# Patient Record
Sex: Male | Born: 1960 | Race: White | Hispanic: No | Marital: Married | Smoking: Never smoker
Health system: Southern US, Community
[De-identification: ages and names within clinical notes are randomized; demographics above are authoritative.]

## PROBLEM LIST (undated history)

## (undated) DIAGNOSIS — I4891 Unspecified atrial fibrillation: Secondary | ICD-10-CM

## (undated) DIAGNOSIS — R931 Abnormal findings on diagnostic imaging of heart and coronary circulation: Secondary | ICD-10-CM

## (undated) DIAGNOSIS — E559 Vitamin D deficiency, unspecified: Secondary | ICD-10-CM

## (undated) DIAGNOSIS — E7800 Pure hypercholesterolemia, unspecified: Secondary | ICD-10-CM

## (undated) HISTORY — DX: Vitamin D deficiency, unspecified: E55.9

## (undated) HISTORY — PX: OTHER SURGICAL HISTORY: SHX169

## (undated) HISTORY — DX: Abnormal findings on diagnostic imaging of heart and coronary circulation: R93.1

## (undated) HISTORY — DX: Pure hypercholesterolemia, unspecified: E78.00

## (undated) HISTORY — PX: VASECTOMY: SHX75

## (undated) HISTORY — DX: Unspecified atrial fibrillation: I48.91

## (undated) HISTORY — PX: MENISCECTOMY: SHX123

---

## 2009-08-17 ENCOUNTER — Emergency Department (HOSPITAL_COMMUNITY): Admission: EM | Admit: 2009-08-17 | Discharge: 2009-08-17 | Payer: Self-pay | Admitting: Emergency Medicine

## 2009-10-16 ENCOUNTER — Encounter: Admission: RE | Admit: 2009-10-16 | Discharge: 2009-10-16 | Payer: Self-pay | Admitting: Occupational Medicine

## 2009-10-19 ENCOUNTER — Encounter (INDEPENDENT_AMBULATORY_CARE_PROVIDER_SITE_OTHER): Payer: Self-pay | Admitting: *Deleted

## 2009-10-29 ENCOUNTER — Encounter (INDEPENDENT_AMBULATORY_CARE_PROVIDER_SITE_OTHER): Payer: Self-pay | Admitting: *Deleted

## 2009-11-02 ENCOUNTER — Ambulatory Visit: Payer: Self-pay | Admitting: Gastroenterology

## 2009-11-16 ENCOUNTER — Telehealth: Payer: Self-pay | Admitting: Gastroenterology

## 2009-12-14 ENCOUNTER — Ambulatory Visit: Payer: Self-pay | Admitting: Gastroenterology

## 2009-12-15 ENCOUNTER — Encounter: Payer: Self-pay | Admitting: Gastroenterology

## 2010-09-07 NOTE — Letter (Signed)
Summary: Previsit letter  [facility]  [address] [place], [place] [address]   Phone: [phone]  Fax: [phone]       10/19/2009 MRN: [id number]  Rodney Banks [address] [place], [place]  [address]  Dear [name],  Welcome to the Gastroenterology Division at [facility].    You are scheduled to see a nurse for your pre-procedure visit on 11-02-09 at 4:30p.m. on the 3rd floor at [facility], [address]  We ask that you try to arrive at our office 15 minutes prior to your appointment time to allow for check-in.  Your nurse visit will consist of discussing your medical and surgical history, your immediate family medical history, and your medications.    Please bring a complete list of all your medications or, if you prefer, bring the medication bottles and we will list them.  We will need to be aware of both prescribed and over the counter drugs.  We will need to know exact dosage information as well.  If you are on blood thinners (Coumadin, Plavix, Aggrenox, Ticlid, etc.) please call our office today/prior to your appointment, as we need to consult with your physician about holding your medication.   Please be prepared to read and sign documents such as consent forms, a financial agreement, and acknowledgement forms.  If necessary, and with your consent, a friend or relative is welcome to sit-in on the nurse visit with you.  Please bring your insurance card so that we may make a copy of it.  If your insurance requires a referral to see a specialist, please bring your referral form from your primary care physician.  No co-pay is required for this nurse visit.     If you cannot keep your appointment, please call [phone] to cancel or reschedule prior to your appointment date.  This allows [place] the opportunity to schedule an appointment for another patient in need of care.    Thank you for choosing [facility] for your medical needs.   We appreciate the opportunity to care for you.  Please visit [place] at our website  to learn more about our practice.                     Sincerely.                                                                                                                   The Gastroenterology Division

## 2010-09-07 NOTE — Miscellaneous (Signed)
Summary: [facility] Previsit/prep  Clinical Lists Changes  Medications: Added new medication of MOVIPREP 100 GM  SOLR (PEG-KCL-NACL-NASULF-NA ASC-C) As per prep instructions. - Signed Rx of MOVIPREP 100 GM  SOLR (PEG-KCL-NACL-NASULF-NA ASC-C) As per prep instructions.;  #1 x 0;  Signed;  Entered by: [name] RN;  Authorized by: Rachael Fee MD;  Method used: Electronically to [facility]  [phone]*, [address] [place], [place], [place]  [address], Ph: [phone] or [phone], Fax: [phone] Observations: Added new observation of NKA: T (11/02/2009 16:18)    Prescriptions: MOVIPREP 100 GM  SOLR (PEG-KCL-NACL-NASULF-NA ASC-C) As per prep instructions.  #1 x 0   Entered by:   [name] RN   Authorized by:   Rachael Fee MD   Signed by:   [name] RN on 11/02/2009   Method used:   Electronically to        [facility]  [phone]* (retail)       [address]       [place], [place]  [address]       Ph: [phone] or [phone]       Fax: [phone]   RxID:   [id number]

## 2010-09-07 NOTE — Letter (Signed)
Summary: Moviprep Instructions  [facility]  [address]   [place], [place] [address]   Phone: [phone]  Fax: [phone]       Rodney Banks    [birth year]    MRN: [id number]        Procedure Day /Date: Monday 11-16-09     Arrival Time: 9:00 a.m.     Procedure Time: 10:00 a.m.     Location of Procedure:                    x   [facility] (4th Floor)                        PREPARATION FOR COLONOSCOPY WITH MOVIPREP   Starting 5 days prior to your procedure  11-11-09 do not eat nuts, seeds, popcorn, corn, beans, peas,  salads, or any raw vegetables.  Do not take any fiber supplements (e.g. Metamucil, Citrucel, and Benefiber).  THE DAY BEFORE YOUR PROCEDURE         DATE:  11-15-09   DAY: Sunday  1.  Drink clear liquids the entire day-NO SOLID FOOD  2.  Do not drink anything colored red or purple.  Avoid juices with pulp.  No orange juice.  3.  Drink at least 64 oz. (8 glasses) of fluid/clear liquids during the day to prevent dehydration and help the prep work efficiently.  CLEAR LIQUIDS INCLUDE: Water Jello Ice Popsicles Tea (sugar ok, no milk/cream) Powdered fruit flavored drinks Coffee (sugar ok, no milk/cream) Gatorade Juice: apple, white grape, white cranberry  Lemonade Clear bullion, consomm, broth Carbonated beverages (any kind) Strained chicken noodle soup Hard Candy                             4.  In the morning, mix first dose of MoviPrep solution:    Empty 1 Pouch A and 1 Pouch B into the disposable container    Add lukewarm drinking water to the top line of the container. Mix to dissolve    Refrigerate (mixed solution should be used within 24 hrs)  5.  Begin drinking the prep at 5:00 p.m. The MoviPrep container is divided by 4 marks.   Every 15 minutes drink the solution down to the next mark (approximately 8 oz) until the full liter is complete.   6.  Follow completed prep with 16 oz of clear liquid of your choice  (Nothing red or purple).  Continue to drink clear liquids until bedtime.  7.  Before going to bed, mix second dose of MoviPrep solution:    Empty 1 Pouch A and 1 Pouch B into the disposable container    Add lukewarm drinking water to the top line of the container. Mix to dissolve    Refrigerate  THE DAY OF YOUR PROCEDURE      DATE: 11-16-09   DAY: Monday  Beginning at  5:00 a.m. (5 hours before procedure):         1. Every 15 minutes, drink the solution down to the next mark (approx 8 oz) until the full liter is complete.  2. Follow completed prep with 16 oz. of clear liquid of your choice.    3. You may drink clear liquids until 8:00 a.m.  (2 HOURS BEFORE PROCEDURE).   MEDICATION INSTRUCTIONS  Unless otherwise instructed, you should take regular prescription medications with a small sip of water  as early as possible the morning of your procedure.        OTHER INSTRUCTIONS  You will need a responsible adult at least 49 years of age to accompany you and drive you home.   This person must remain in the waiting room during your procedure.  Wear loose fitting clothing that is easily removed.  Leave jewelry and other valuables at home.  However, you may wish to bring a book to read or  an iPod/MP3 player to listen to music as you wait for your procedure to start.  Remove all body piercing jewelry and leave at home.  Total time from sign-in until discharge is approximately 2-3 hours.  You should go home directly after your procedure and rest.  You can resume normal activities the  day after your procedure.  The day of your procedure you should not:   Drive   Make legal decisions   Operate machinery   Drink alcohol   Return to work  You will receive specific instructions about eating, activities and medications before you leave.    The above instructions have been reviewed and explained to me by  Rodney Almas RN  November 02, 2009 5:10 PM     I fully  understand and can verbalize these instructions _____________________________ Date _________

## 2010-09-07 NOTE — Procedures (Signed)
Summary: Colonoscopy  Patient: Rodney Banks Note: All result statuses are Final unless otherwise noted.  Tests: (1) Colonoscopy (COL)   COL Colonoscopy           DONE     [facility]     [address]     [place], [place]  [address]           COLONOSCOPY PROCEDURE REPORT           PATIENT:  Rodney Banks, Rodney Banks  MR#:  [id number]     BIRTHDATE:  [birth year], 48 yrs. old  GENDER:  male     ENDOSCOPIST:  Rachael Fee, MD     referred by:   Kathrine Haddock, MD     PROCEDURE DATE:  12/14/2009     PROCEDURE:  Colonoscopy with snare polypectomy     ASA CLASS:  Class II     INDICATIONS:  Elevated Risk Screening, mother had colon cancer     MEDICATIONS:   Fentanyl 75 mcg IV, Versed 6 mg IV           DESCRIPTION OF PROCEDURE:   After the risks benefits and     alternatives of the procedure were thoroughly explained, informed     consent was obtained.  Digital rectal exam was performed and     revealed no rectal masses.   The LB [id number] endoscope     was introduced through the anus and advanced to the cecum, which     was identified by both the appendix and ileocecal valve, without     limitations.  The quality of the prep was adequate, using MiraLax.     The instrument was then slowly withdrawn as the colon was fully     examined.     <<PROCEDUREIMAGES>>     FINDINGS:  Three polyps were removed, all sent to pathology (jar     1). Two were sessile, 3-22mm across, removed with cold snare,     located in descending and sigmoid colon segments. One was     pedunculated, 6mm across, removed with snare/cautery, located in     descending colon (see image5 and image7).  This was otherwise a     normal examination of the colon (see image8, image2, and image3).     Retroflexed views in the rectum revealed no abnormalities.    The     scope was then withdrawn from the patient and the procedure     completed.     COMPLICATIONS:  None           ENDOSCOPIC IMPRESSION:     1)  Three subcentimeter polyps, all removed and sent to     pathology.     2) Otherwise normal examination           RECOMMENDATIONS:     1) Given your significant family history of colon cancer, you     should have a repeat colonoscopy in 5 years even if the polyps     removed today are NOT precancerous.  If ALL three polyps are     indeed precancerous, you will need a repeat colonoscopy in 3     years.     2) You will receive a letter within 1-2 weeks with the results     of your biopsy as well as final recommendations. Please call my     office if you have not received a letter after 3 weeks.  ______________________________     Rachael Fee, MD           n.     eSIGNED:   Rachael Fee at 12/14/2009 10:55 AM           Rodney Banks, [id number]  Note: An exclamation mark (!) indicates a result that was not dispersed into the flowsheet. Document Creation Date: 12/14/2009 10:55 AM _______________________________________________________________________  (1) Order result status: Final Collection or observation date-time: 12/14/2009 10:50 Requested date-time:  Receipt date-time:  Reported date-time:  Referring Physician:   Ordering Physician: Rob Bunting [phone]) Specimen Source:  Source: Launa Grill Order Number: [id number] Lab site:   Appended Document: Colonoscopy     Procedures Next Due Date:    Colonoscopy: 12/2014

## 2010-09-07 NOTE — Letter (Signed)
Summary: Results Letter  [facility]  [address] [place], [place] [address]   Phone: [phone]  Fax: [phone]        Dec 15, 2009 MRN: [id number]    Rodney Banks [address] [place], [place]  [address]    Dear [name],   At least one of the polyps removed during your recent procedure was proven to be adenomatous.  These are pre-cancerous polyps that may have grown into cancers if they had not been removed.  Based on current nationally recognized surveillance guidelines, I recommend that you have a repeat colonoscopy in 5 years.  We will therefore put your information in our reminder system and will contact you in 5 years to schedule a repeat procedure.  Please call if you have any questions or concerns.       Sincerely,  Rachael Fee MD  This letter has been electronically signed by your physician.  Appended Document: Results Letter letter mailed 5.11.11

## 2010-09-07 NOTE — Progress Notes (Signed)
Summary: cx same day proc  Phone Note Call from Patient Call back at Home Phone [phone]   Caller: Patient Call For: Rodney Banks Summary of Call: Patient cancelled his procedure for this morning because he has the flu and is running a fever. Patient states that he'll call back to reschedule.  Are you going to charge? Initial call taken by: Tawni Levy,  November 16, 2009 8:40 AM  Follow-up for Phone Call        no, not if he has flu symptoms Follow-up by: Rachael Fee MD,  November 16, 2009 8:46 AM     Appended Document: cx same day proc Patient NOT BILLED.

## 2014-04-25 ENCOUNTER — Encounter: Payer: Self-pay | Admitting: Gastroenterology

## 2014-06-13 ENCOUNTER — Ambulatory Visit (INDEPENDENT_AMBULATORY_CARE_PROVIDER_SITE_OTHER): Payer: 59 | Admitting: Cardiology

## 2014-06-13 ENCOUNTER — Encounter: Payer: Self-pay | Admitting: Cardiology

## 2014-06-13 ENCOUNTER — Encounter: Payer: Self-pay | Admitting: *Deleted

## 2014-06-13 VITALS — BP 110/74 | HR 75 | Ht 69.0 in | Wt 179.8 lb

## 2014-06-13 DIAGNOSIS — I480 Paroxysmal atrial fibrillation: Secondary | ICD-10-CM

## 2014-06-13 DIAGNOSIS — E785 Hyperlipidemia, unspecified: Secondary | ICD-10-CM

## 2014-06-13 DIAGNOSIS — I4891 Unspecified atrial fibrillation: Secondary | ICD-10-CM

## 2014-06-13 MED ORDER — DILTIAZEM HCL 120 MG PO TABS: 120.0000 mg | ORAL_TABLET | ORAL | Status: DC | PRN

## 2014-06-13 MED ORDER — DILTIAZEM HCL 120 MG PO TABS
120.0000 mg | ORAL_TABLET | ORAL | Status: DC | PRN
Start: 2014-06-13 — End: 2015-03-23

## 2014-06-13 MED ORDER — DILTIAZEM HCL ER COATED BEADS 120 MG PO CP24: ORAL_CAPSULE | ORAL | Status: DC

## 2014-06-13 NOTE — Progress Notes (Signed)
Patient ID: Rodney Banks, male   DOB: November 28, 1960, 53 y.o.   MRN: [id number]    Patient Name: Rodney Banks Date of Encounter: 06/13/2014  Primary Care Provider:  No primary care provider on file. Primary Cardiologist:  Rodney Banks  Problem List   Past Medical History  Diagnosis Date  . Atrial fibrillation   . Hypercholesteremia   . Vitamin D deficiency    Past Surgical History  Procedure Laterality Date  . Right leg      fracture surgical repair  . Bowel obstruction    . Fractureed right ankle    . Meniscectomy      both knees  . Vasectomy      Allergies  No Known Allergies  HPI  A very pleasant 53 year old male originally from [place], with prior medical history of proximal atrial fibrillation and hyperlipidemia who is coming to establish care for his atrial fibrillation. The patient states that his first episode of atrial fibrillation was 15 years ago he had a stressful weekend flying back from [place] getting over cold. That episode lasted for about 4 days when he presented to the ER and he eventually cardioverted on his own. He had another episode 3 years ago that lasted about 2 days. No episode of A. Fib or any palpitations since then. He denies any syncope. He denies any chest pain or dyspnea on exertion. He goes to gym 3 times a week for an hour. His father died of myocardial infarction at age of 74. He states that his diet is fairly poor as he eats lot of sweets especially chocolate.     Home Medications  Prior to Admission medications   Medication Sig Start Date End Date Taking? Authorizing Provider  aspirin 325 MG tablet Take 325 mg by mouth daily.   Yes Historical Provider, MD    Family History  Family History  Problem Relation Age of Onset  . Cancer - Colon Mother   . Heart attack Father     Social History  History   Social History  . Marital Status: Married    Spouse Name: N/A    Number of Children: N/A  . Years of Education: N/A     Occupational History  . Not on file.   Social History Main Topics  . Smoking status: Never Smoker   . Smokeless tobacco: Not on file  . Alcohol Use: Not on file  . Drug Use: Not on file  . Sexual Activity: Not on file   Other Topics Concern  . Not on file   Social History Narrative  . No narrative on file     Review of Systems, as per HPI, otherwise negative General:  No chills, fever, night sweats or weight changes.  Cardiovascular:  No chest pain, dyspnea on exertion, edema, orthopnea, palpitations, paroxysmal nocturnal dyspnea. Dermatological: No rash, lesions/masses Respiratory: No cough, dyspnea Urologic: No hematuria, dysuria Abdominal:   No nausea, vomiting, diarrhea, bright red blood per rectum, melena, or hematemesis Neurologic:  No visual changes, wkns, changes in mental status. All other systems reviewed and are otherwise negative except as noted above.  Physical Exam  Blood pressure 110/74, pulse 75, height 5\' 9"  (1.753 m), weight 179 lb 12.8 oz (81.557 kg).  General: Pleasant, NAD Psych: Normal affect. Neuro: Alert and oriented X 3. Moves all extremities spontaneously. HEENT: Normal  Neck: Supple without bruits or JVD. Lungs:  Resp regular and unlabored, CTA. Heart: RRR no s3, s4, or murmurs. Abdomen: Soft, non-tender,  non-distended, BS + x 4.  Extremities: No clubbing, cyanosis or edema. DP/PT/Radials 2+ and equal bilaterally.  Labs:  No results for input(s): CKTOTAL, CKMB, TROPONINI in the last 72 hours. No results found for: WBC, HGB, HCT, MCV, PLT  No results found for: DDIMER Invalid input(s): POCBNP No results found for: NA, K, CL, CO2, GLUCOSE, BUN, CREATININE, CALCIUM, PROT, ALBUMIN, AST, ALT, ALKPHOS, BILITOT, GFRNONAA, GFRAA No results found for: CHOL, HDL, LDLCALC, TRIG   HDL 56, triglycerides 86, LDL 178  Accessory Clinical Findings  Echocardiogram - none  ECG - SR, normal ECG    Assessment & Plan  53 year old male  1.  Paroxysmal atrial fibrillation - total of 2 episodes in last 15 years, the patient was nightly pill in the pocket diltiazem immediate release 120 mg as needed. His CHAD-VASc 1, we'll recommend to continue aspirin for now.  2. Blood pressure - controlled  3. Hyperlipidemia - the patient has high LDL 178, he is trying to avoid taking medicines and feels like he has lot of improvement in his diet. He is advised to buy over-the-counter red yeast rice  Follow up in one year with lipids.    Rodney Spark, MD, [facility] 06/13/2014, 8:46 AM

## 2014-06-13 NOTE — Patient Instructions (Signed)
Your physician has recommended you make the following change in your medication:    TAKE DILTIAZEM 120 MG AS NEEDED FOR ATRIAL FIBRILLATION    Your physician wants you to follow-up in: [place] will receive a reminder letter in the mail two months in advance. If you don't receive a letter, please call our office to schedule the follow-up appointment.

## 2014-12-26 ENCOUNTER — Encounter: Payer: Self-pay | Admitting: Gastroenterology

## 2015-01-16 ENCOUNTER — Encounter: Payer: Self-pay | Admitting: Gastroenterology

## 2015-03-23 ENCOUNTER — Ambulatory Visit (AMBULATORY_SURGERY_CENTER): Payer: Self-pay

## 2015-03-23 VITALS — Ht 69.0 in | Wt 178.6 lb

## 2015-03-23 DIAGNOSIS — Z860100 Personal history of colon polyps, unspecified: Secondary | ICD-10-CM

## 2015-03-23 MED ORDER — MOVIPREP 100 G PO SOLR: 1.0000 | Freq: Once | ORAL | Status: DC

## 2015-03-23 NOTE — Progress Notes (Signed)
No allergies to eggs or soy No diet/weight loss meds No home oxygen No past problems with anesthesia  Has email  Emmi instructions given for colonoscopy  No longer takes Dilitiazem for AFib

## 2015-04-06 ENCOUNTER — Encounter: Payer: Self-pay | Admitting: Gastroenterology

## 2015-04-28 ENCOUNTER — Encounter: Payer: Self-pay | Admitting: Gastroenterology

## 2015-04-28 ENCOUNTER — Ambulatory Visit (AMBULATORY_SURGERY_CENTER): Payer: 59 | Admitting: Gastroenterology

## 2015-04-28 VITALS — BP 115/77 | HR 61 | Temp 97.1°F | Resp 38 | Ht 69.0 in | Wt 178.0 lb

## 2015-04-28 DIAGNOSIS — Z800 Family history of malignant neoplasm of digestive organs: Secondary | ICD-10-CM

## 2015-04-28 DIAGNOSIS — Z86010 Personal history of colonic polyps: Secondary | ICD-10-CM | POA: Diagnosis present

## 2015-04-28 MED ORDER — SODIUM CHLORIDE 0.9 % IV SOLN: 500.0000 mL | INTRAVENOUS | Status: DC

## 2015-04-28 NOTE — Progress Notes (Signed)
Report to PACU, RN, vss, BBS= Clear.  

## 2015-04-28 NOTE — Patient Instructions (Signed)
YOU HAD AN ENDOSCOPIC PROCEDURE TODAY AT THE [facility]:   Refer to the procedure report that was given to you for any specific questions about what was found during the examination.  If the procedure report does not answer your questions, please call your gastroenterologist to clarify.  If you requested that your care partner not be given the details of your procedure findings, then the procedure report has been included in a sealed envelope for you to review at your convenience later.  YOU SHOULD EXPECT: Some feelings of bloating in the abdomen. Passage of more gas than usual.  Walking can help get rid of the air that was put into your GI tract during the procedure and reduce the bloating. If you had a lower endoscopy (such as a colonoscopy or flexible sigmoidoscopy) you may notice spotting of blood in your stool or on the toilet paper. If you underwent a bowel prep for your procedure, you may not have a normal bowel movement for a few days.  Please Note:  You might notice some irritation and congestion in your nose or some drainage.  This is from the oxygen used during your procedure.  There is no need for concern and it should clear up in a day or so.  SYMPTOMS TO REPORT IMMEDIATELY:   Following lower endoscopy (colonoscopy or flexible sigmoidoscopy):  Excessive amounts of blood in the stool  Significant tenderness or worsening of abdominal pains  Swelling of the abdomen that is new, acute  Fever of 100F or higher   For urgent or emergent issues, a gastroenterologist can be reached at any hour by calling [phone].   DIET: Your first meal following the procedure should be a small meal and then it is ok to progress to your normal diet. Heavy or fried foods are harder to digest and may make you feel nauseous or bloated.  Likewise, meals heavy in dairy and vegetables can increase bloating.  Drink plenty of fluids but you should avoid alcoholic beverages for 24  hours.  ACTIVITY:  You should plan to take it easy for the rest of today and you should NOT DRIVE or use heavy machinery until tomorrow (because of the sedation medicines used during the test).    FOLLOW UP: Our staff will call the number listed on your records the next business day following your procedure to check on you and address any questions or concerns that you may have regarding the information given to you following your procedure. If we do not reach you, we will leave a message.  However, if you are feeling well and you are not experiencing any problems, there is no need to return our call.  We will assume that you have returned to your regular daily activities without incident.  If any biopsies were taken you will be contacted by phone or by letter within the next 1-3 weeks.  Please call us at [phone] if you have not heard about the biopsies in 3 weeks.    SIGNATURES/CONFIDENTIALITY: You and/or your care partner have signed paperwork which will be entered into your electronic medical record.  These signatures attest to the fact that that the information above on your After Visit Summary has been reviewed and is understood.  Full responsibility of the confidentiality of this discharge information lies with you and/or your care-partner. 

## 2015-04-28 NOTE — Op Note (Signed)
[facility]  [address] [place], [address]   COLONOSCOPY PROCEDURE REPORT  PATIENT: Rodney Banks, Rodney Banks  MR#: [id number] BIRTHDATE: [birth year] , 15  yrs. old GENDER: male ENDOSCOPIST: Milus Banister, MD PROCEDURE DATE:  04/28/2015 PROCEDURE:   Colonoscopy, surveillance First Screening Colonoscopy - Avg.  risk and is 50 yrs.  old or older - No.  Prior Negative Screening - Now for repeat screening. N/A  History of Adenoma - Now for follow-up colonoscopy & has been > or = to 3 yrs.  Yes hx of adenoma.  Has been 3 or more years since last colonoscopy.  History of Adenoma - Now for follow-up colonoscopy & has been > or = to 3 yrs.  high risk ASA CLASS:   Class II INDICATIONS:Surveillance due to prior colonic neoplasia and colonoscopy 2011 Dr.  Ardis Hughs 3 polyps (two were TAs), also his mother had colon cancer. MEDICATIONS: Monitored anesthesia care and Propofol 200 mg IV  DESCRIPTION OF PROCEDURE:   After the risks benefits and alternatives of the procedure were thoroughly explained, informed consent was obtained.  The digital rectal exam revealed no abnormalities of the rectum.   The LB [id number]  endoscope was introduced through the anus and advanced to the cecum, which was identified by both the appendix and ileocecal valve. No adverse events experienced.   The quality of the prep was adequate  The instrument was then slowly withdrawn as the colon was fully examined. Estimated blood loss is zero unless otherwise noted in this procedure report.    COLON FINDINGS: A normal appearing cecum, ileocecal valve, and appendiceal orifice were identified.  The ascending, transverse, descending, sigmoid colon, and rectum appeared unremarkable. Retroflexed views revealed no abnormalities. The time to cecum = 7.2 Withdrawal time = 8.2   The scope was withdrawn and the procedure completed. COMPLICATIONS: There were no immediate complications.  ENDOSCOPIC  IMPRESSION: Normal colonoscopy No polyps or cancers  RECOMMENDATIONS: Given your significant family history of colon cancer (mother), you should have a repeat colonoscopy in 5 years  eSigned:  Milus Banister, MD 04/28/2015 2:52 PM   cc: Randa Ngo, MD

## 2015-04-29 ENCOUNTER — Telehealth: Payer: Self-pay

## 2015-04-29 NOTE — Telephone Encounter (Signed)
  Follow up Call-  Call back number 04/28/2015  Post procedure Call Back phone  # [phone]  Permission to leave phone message Yes     Patient questions:  Do you have a fever, pain , or abdominal swelling? No. Pain Score  0 *  Have you tolerated food without any problems? Yes.    Have you been able to return to your normal activities? Yes.    Do you have any questions about your discharge instructions: Diet   No. Medications  No. Follow up visit  No.  Do you have questions or concerns about your Care? No.  Actions: * If pain score is 4 or above: No action needed, pain <4.  No problems per the pt. maw

## 2017-05-29 ENCOUNTER — Telehealth: Payer: Self-pay

## 2017-05-29 NOTE — Telephone Encounter (Signed)
SENT NOTES TO SCHEDULING 

## 2017-07-05 ENCOUNTER — Telehealth: Payer: Self-pay | Admitting: Cardiology

## 2017-07-05 NOTE — Telephone Encounter (Signed)
Received incoming records from [facility] for upcoming appointment on 07/13/17 @ 9:40am with Dr. Percival Spanish. Records given to [facility] in Medical Records. 07/05/17 ab

## 2017-07-12 NOTE — Progress Notes (Signed)
Cardiology Office Note   Date:  07/13/2017   ID:  Griffen Frayne, DOB November 08, 1960, MRN [id number]  PCP:  London Pepper, MD  Cardiologist:   Minus Breeding, MD  Referring:  London Pepper, MD  Chief Complaint  Patient presents with  . Shortness of Breath     History of Present Illness: Rodney Banks is a 56 y.o. male who is referred by London Pepper, MD for evaluation of dyspnea.  He saw Dr. Meda Coffee in 2015.  He had paroxysms of a couple of times and in 2015 was treated with pill in pocket Cardizem.  However, he has never needed to use that.  He has not had any tachypalpitations since that time.  He might get dyspnea with significant exertion.  However, because of this and because of a strong family history of early coronary disease he is referred.  He exercises routinely.  He  runs does not elliptical and bikes.  At this he denies any chest pressure, neck or arm discomfort.  He does any palpitations, presyncope or syncope.     He denies any PND orthopnea.   Past Medical History:  Diagnosis Date  . Atrial fibrillation ([place])   . Hypercholesteremia   . Vitamin D deficiency     Past Surgical History:  Procedure Laterality Date  . bowel obstruction    . fractureed right ankle    . MENISCECTOMY     both knees  . right leg     fracture surgical repair  . VASECTOMY       No current outpatient medications on file.   No current facility-administered medications for this visit.     Allergies:   Patient has no known allergies.    Social History:  The patient  reports that  has never smoked. he has never used smokeless tobacco. He reports that he drinks about 0.6 oz of alcohol per week. He reports that he does not use drugs.   Family History:  The patient's family history includes Cancer - Colon in his mother; Colon cancer in his mother; Heart attack (age of onset: 2) in his father; Heart disease in his paternal grandfather; Lung disease in his maternal grandfather.    ROS:   Please see the history of present illness.   Otherwise, review of systems are positive for none.   All other systems are reviewed and negative.    PHYSICAL EXAM: VS:  BP 110/74   Pulse 69   Ht 5\' 9"  (1.753 m)   Wt 173 lb (78.5 kg)   BMI 25.55 kg/m  , BMI Body mass index is 25.55 kg/m. GENERAL:  Well appearing HEENT:  Pupils equal round and reactive, fundi not visualized, oral mucosa unremarkable NECK:  No jugular venous distention, waveform within normal limits, carotid upstroke brisk and symmetric, no bruits, no thyromegaly LYMPHATICS:  No cervical, inguinal adenopathy LUNGS:  Clear to auscultation bilaterally BACK:  No CVA tenderness CHEST:  Unremarkable HEART:  PMI not displaced or sustained,S1 and S2 within normal limits, no S3, no S4, no clicks, no rubs, no murmurs ABD:  Flat, positive bowel sounds normal in frequency in pitch, no bruits, no rebound, no guarding, no midline pulsatile mass, no hepatomegaly, no splenomegaly EXT:  2 plus pulses throughout, no edema, no cyanosis no clubbing SKIN:  No rashes no nodules NEURO:  Cranial nerves II through XII grossly intact, motor grossly intact throughout PSYCH:  Cognitively intact, oriented to person place and time    EKG:  EKG  is ordered today. The ekg ordered today demonstrates sinus rhythm, rate 69, axis within normal limits, intervals within normal limits, no acute ST-T wave changes.   Recent Labs: No results found for requested labs within last 8760 hours.    Lipid Panel No results found for: CHOL, TRIG, HDL, CHOLHDL, VLDL, LDLCALC, LDLDIRECT    Wt Readings from Last 3 Encounters:  07/13/17 173 lb (78.5 kg)  04/28/15 178 lb (80.7 kg)  03/23/15 178 lb 9.6 oz (81 kg)      Other studies Reviewed: Additional studies/ records that were reviewed today include: Office records. Review of the above records demonstrates:  Please see elsewhere in the note.     ASSESSMENT AND PLAN:  DYSPNEA: This is mild but he does have  a strong family history.  Screening with a coronary calcium would be very helpful.  This also would help calculate his risk and to allow [place] to understand further med titration as below.  DYSLIPIDEMIA: His last LDL was 152 with triglycerides 64 and HDL of 70.  His Framingham risk score would be 6%.  In order to calculate a MESA score I will order the coronary calcium.  This will determine whether or not he should be on a statin or whether he should take aspirin.  PAF:  Mr. Arieon Corcoran has a CHA2DS2 - VASc score of 1.  He has had no recurrent symptoms consistent with this.  No change in therapy is indicated.  Current medicines are reviewed at length with the patient today.  The patient does not have concerns regarding medicines.  The following changes have been made:  no change  Labs/ tests ordered today include:   Orders Placed This Encounter  Procedures  . CT CARDIAC SCORING  . EKG 12-Lead     Disposition:   FU with me as needed    Signed, Minus Breeding, MD  07/13/2017 12:53 PM    [place]

## 2017-07-13 ENCOUNTER — Encounter: Payer: Self-pay | Admitting: Cardiology

## 2017-07-13 ENCOUNTER — Ambulatory Visit: Payer: 59 | Admitting: Cardiology

## 2017-07-13 VITALS — BP 110/74 | HR 69 | Ht 69.0 in | Wt 173.0 lb

## 2017-07-13 DIAGNOSIS — E785 Hyperlipidemia, unspecified: Secondary | ICD-10-CM | POA: Diagnosis not present

## 2017-07-13 DIAGNOSIS — R0602 Shortness of breath: Secondary | ICD-10-CM | POA: Diagnosis not present

## 2017-07-13 DIAGNOSIS — I480 Paroxysmal atrial fibrillation: Secondary | ICD-10-CM

## 2017-07-13 NOTE — Patient Instructions (Signed)
Medication Instructions:  Continue current medications  If you need a refill on your cardiac medications before your next appointment, please call your pharmacy.  Labwork: None Ordered   Testing/Procedures: Your physician has requested that you have a Coronary Calcium Score Test. This test is done at our [address] Office.   Follow-Up: Your physician wants you to follow-up in: As Needed.      Thank you for choosing [facility]!!       

## 2017-07-20 ENCOUNTER — Ambulatory Visit (INDEPENDENT_AMBULATORY_CARE_PROVIDER_SITE_OTHER)
Admission: RE | Admit: 2017-07-20 | Discharge: 2017-07-20 | Disposition: A | Discharge status: Home / Self Care | Payer: 59 | Source: Ambulatory Visit | Attending: Cardiology | Admitting: Cardiology

## 2017-07-20 DIAGNOSIS — R0602 Shortness of breath: Secondary | ICD-10-CM

## 2017-07-24 ENCOUNTER — Encounter: Payer: Self-pay | Admitting: Podiatry

## 2017-07-24 ENCOUNTER — Ambulatory Visit: Payer: 59 | Admitting: Podiatry

## 2017-07-24 DIAGNOSIS — L600 Ingrowing nail: Secondary | ICD-10-CM

## 2017-07-24 DIAGNOSIS — L03032 Cellulitis of left toe: Secondary | ICD-10-CM

## 2017-07-24 NOTE — Patient Instructions (Signed)

## 2017-07-25 ENCOUNTER — Telehealth: Payer: Self-pay | Admitting: Cardiology

## 2017-07-25 NOTE — Progress Notes (Signed)
Subjective:   Patient ID: Rodney Banks, male   DOB: 56 y.o.   MRN: [id number]   HPI Patient presents stating he has had an irritated left hallux nail and he feels like it is going to get further infected and he states that he is getting ready to go to [place] hiking and he would like something temporary with something to be done long-term when he returns   Review of Systems  All other systems reviewed and are negative.       Objective:  Physical Exam  Constitutional: He appears well-developed and well-nourished.  Cardiovascular: Intact distal pulses.  Pulmonary/Chest: Effort normal.  Musculoskeletal: Normal range of motion.  Neurological: He is alert.  Skin: Skin is warm.  Nursing note and vitals reviewed.   Neurovascular status intact muscle strength adequate range of motion within normal limits with patient found to have incurvated nail border medial left with pain distal redness but no active drainage noted.  It has been sore for fairly long time and is incurvated in the corner and tender with palpation     Assessment:  Ingrown toenail deformity left hallux with paronychia of a mild nature     Plan:  H&P condition reviewed and at this point I recommended careful removal of the nail border with cleaning of the flash allowing channel for drainage and then permanent procedure to be done in approximately 1 month.  I infiltrated the left hallux 60 mg like Marcaine mixture under sterile conditions I removed some of the medial border remove some proud flesh to allow drainage and applied sterile dressing.  Reappoint for permanent procedure in approximately 1 month.[email or web address]

## 2017-07-25 NOTE — Telephone Encounter (Signed)
°  Follow Up  Calling to follow up on call from late last week regarding results from Dr. Percival Spanish. Please call.

## 2017-07-25 NOTE — Telephone Encounter (Signed)
Pt calling requesting results on coronary calcium test.

## 2017-07-26 NOTE — Telephone Encounter (Signed)
Spoke with pt, he reports dr hochrein called him Friday? And left a message. The patient is at the airport and will be in [place] for 3 weeks and wanted to get the results of his cardiac CT scan. Patient aware there are no comments on the result from dr hochrein. The patient would like me to make dr hochrein aware to return his call. Will forward to dr hochrein in the [place] office. Pt agreed with this plan.

## 2017-07-27 NOTE — Telephone Encounter (Signed)
Dr Percival Spanish attempted to contact pt by phone yesterday from the [facility] office.  He left the patient a message he will try to contact him again to discuss the results of his testing.

## 2017-08-22 ENCOUNTER — Telehealth: Payer: Self-pay | Admitting: *Deleted

## 2017-08-22 DIAGNOSIS — R931 Abnormal findings on diagnostic imaging of heart and coronary circulation: Secondary | ICD-10-CM

## 2017-08-22 NOTE — Telephone Encounter (Signed)
-----   Message from Minus Breeding, MD sent at 08/22/2017 12:33 PM EST ----- Abnormal calcium score.  Please schedule POET (Plain Old Exercise Treadmill).

## 2017-08-22 NOTE — Telephone Encounter (Signed)
Pt aware of his coronary calcium score, GXT ordered and send to scheduler to be schedule, pt made aware that someone from our scheduling department will call and schedule GXT.

## 2017-08-24 ENCOUNTER — Encounter: Payer: Self-pay | Admitting: Podiatry

## 2017-08-24 ENCOUNTER — Ambulatory Visit: Payer: 59 | Admitting: Podiatry

## 2017-08-24 DIAGNOSIS — L600 Ingrowing nail: Secondary | ICD-10-CM

## 2017-08-24 NOTE — Patient Instructions (Signed)

## 2017-08-24 NOTE — Telephone Encounter (Signed)
Barbaraann Barthel N      08/22/17 2:02 PM  Note    Pt aware of his coronary calcium score, GXT ordered and send to scheduler to be schedule, pt made aware that someone from our scheduling department will call and schedule GXT.    Barbaraann Barthel N      08/22/17 2:00 PM  Note    ----- Message from Minus Breeding, MD sent at 08/22/2017 12:33 PM EST ----- Abnormal calcium score.  Please schedule POET (Plain Old Exercise Treadmill).

## 2017-08-25 NOTE — Progress Notes (Signed)
Subjective:   Patient ID: Rodney Banks, male   DOB: 57 y.o.   MRN: [id number]   HPI Patient presents stating his nails are great on the trip and is ready to have it fixed permanently and he did have slight concerns about his right one also   ROS      Objective:  Physical Exam  Neurovascular status is intact with patient found to have a crusted irritated left hallux medial border from previous paronychia excision with mild changes on the distal portion of the right hallux with slight discomfort noted     Assessment:  Chronic ingrown toenail deformity left hallux with mild deformity right     Plan:  H&P condition reviewed and will get a focus on the left one.  I discussed correction and explained risk and allow patient to sign consent form.  Today I infiltrated the left hallux 60 mg Xylocaine Marcaine mixture did sterile prep to the left hallux and under sterile conditions with sterile instrumentation remove the medial border exposed root and applied phenol 3 applications 30 seconds followed by alcohol lavage and sterile dressing.  Gave instructions on soaks and reappoint and patient is encouraged to call with any questions

## 2017-08-30 ENCOUNTER — Telehealth (HOSPITAL_COMMUNITY): Payer: Self-pay

## 2017-08-30 NOTE — Telephone Encounter (Signed)
Encounter complete. 

## 2017-09-01 ENCOUNTER — Ambulatory Visit (HOSPITAL_COMMUNITY)
Admission: RE | Admit: 2017-09-01 | Discharge: 2017-09-01 | Disposition: A | Discharge status: Home / Self Care | Payer: 59 | Source: Ambulatory Visit | Attending: Cardiology | Admitting: Cardiology

## 2017-09-01 ENCOUNTER — Encounter (HOSPITAL_COMMUNITY): Payer: Self-pay | Admitting: *Deleted

## 2017-09-01 DIAGNOSIS — R931 Abnormal findings on diagnostic imaging of heart and coronary circulation: Secondary | ICD-10-CM | POA: Diagnosis not present

## 2017-09-01 LAB — EXERCISE TOLERANCE TEST
CHL CUP RESTING HR STRESS: 76 {beats}/min
CSEPEDS: 1 s
CSEPEW: 17.5 METS
CSEPPHR: 173 {beats}/min
Exercise duration (min): 15 min
MPHR: 165 {beats}/min
Percent HR: 104 %
RPE: 18

## 2017-09-01 NOTE — Progress Notes (Unsigned)
Abnormal ETT was reviewed by DR. Crenshaw. Patient was given the ok to be discharged to go home.

## 2017-09-14 ENCOUNTER — Encounter: Payer: Self-pay | Admitting: Cardiology

## 2017-09-14 NOTE — Progress Notes (Addendum)
Cardiology Office Note   Date:  09/15/2017   ID:  Rodney Banks, DOB [birth year], MRN [id number]  PCP:  London Pepper, MD  Cardiologist:   Minus Breeding, MD  Referring:  London Pepper, MD   Chief Complaint  Patient presents with  . Abnormal Stress Test     History of Present Illness: Rodney Banks is a 57 y.o. male who is referred by London Pepper, MD for evaluation of coronary calcium.  He has SOB and sent him for a calcium score that was 174 and 86th percentile.  I sent him for a POET (Plain Old Exercise Treadmill).  I reviewed the treadmill.  At the very peak of exercise there were ST changes that could represent ischemia.  He has no symptoms still.  The patient denies any new symptoms such as chest discomfort, neck or arm discomfort. There has been no new shortness of breath, PND or orthopnea. There have been no reported palpitations, presyncope or syncope.   Past Medical History:  Diagnosis Date  . Atrial fibrillation ([place])   . Elevated coronary artery calcium score   . Hypercholesteremia   . Vitamin D deficiency     Past Surgical History:  Procedure Laterality Date  . bowel obstruction    . fractureed right ankle    . MENISCECTOMY     both knees  . right leg     fracture surgical repair  . VASECTOMY       Current Outpatient Medications  Medication Sig Dispense Refill  . metoprolol tartrate (LOPRESSOR) 50 MG tablet Take 1 tablet (50 mg total) by mouth once for 1 dose. 1 tablet 0   No current facility-administered medications for this visit.     Allergies:   Patient has no known allergies.    ROS:  Please see the history of present illness.   Otherwise, review of systems are positive for none.   All other systems are reviewed and negative.    PHYSICAL EXAM: VS:  BP 106/70   Pulse 75   Ht 5\' 9"  (1.753 m)   Wt 176 lb 12.8 oz (80.2 kg)   BMI 26.11 kg/m  , BMI Body mass index is 26.11 kg/m.  GENERAL:  Well appearing NECK:  No jugular venous  distention, waveform within normal limits, carotid upstroke brisk and symmetric, no bruits, no thyromegaly LUNGS:  Clear to auscultation bilaterally CHEST:  Unremarkable HEART:  PMI not displaced or sustained,S1 and S2 within normal limits, no S3, no S4, no clicks, no rubs, no murmurs ABD:  Flat, positive bowel sounds normal in frequency in pitch, no bruits, no rebound, no guarding, no midline pulsatile mass, no hepatomegaly, no splenomegaly EXT:  2 plus pulses throughout, no edema, no cyanosis no clubbing   EKG:  EKG is not  ordered today.   Recent Labs: No results found for requested labs within last 8760 hours.    Lipid Panel No results found for: CHOL, TRIG, HDL, CHOLHDL, VLDL, LDLCALC, LDLDIRECT    Wt Readings from Last 3 Encounters:  09/15/17 176 lb 12.8 oz (80.2 kg)  07/13/17 173 lb (78.5 kg)  04/28/15 178 lb (80.7 kg)      Other studies Reviewed: Additional studies/ records that were reviewed today include: POET (Plain Old Exercise Treadmill) Review of the above records demonstrates:  See elsewhere   ASSESSMENT AND PLAN:  CORONARY CALCIUM:    I will address this as below.  ABNORMAL POET (Plain Old Exercise Treadmill):  Given this and his coronary  calcium along with risk factors the possibility of obstructive CAD is moderately high.  Further imaging is indicated and I will schedule a coronary CTA.    DYSLIPIDEMIA: His last LDL was 152 with triglycerides 64 and HDL of 70.  His Framingham risk score would be 6%.  MESA score was 8.8.  He will think about whether he wants to take a statin based on the results of the CT.  PAF:  Mr. [name] has a CHA2DS2 - VASc score of  1.  He had no symptomatic recurrence.  No change in therapy is indicated.  Current medicines are reviewed at length with the patient today.  The patient does not have concerns regarding medicines.  The following changes have been made:   None  Labs/ tests ordered today include:    Orders Placed  This Encounter  Procedures  . CT CORONARY MORPH W/CTA COR W/SCORE W/CA W/CM &/OR WO/CM  . CT CORONARY FRACTIONAL FLOW RESERVE DATA PREP  . CT CORONARY FRACTIONAL FLOW RESERVE FLUID ANALYSIS  . Basic Metabolic Panel (BMET)     Disposition:   FU with me in one year.    Signed, Minus Breeding, MD  09/15/2017 4:14 PM    [facility]

## 2017-09-15 ENCOUNTER — Ambulatory Visit: Payer: 59 | Admitting: Cardiology

## 2017-09-15 ENCOUNTER — Encounter: Payer: Self-pay | Admitting: Cardiology

## 2017-09-15 VITALS — BP 106/70 | HR 75 | Ht 69.0 in | Wt 176.8 lb

## 2017-09-15 DIAGNOSIS — R931 Abnormal findings on diagnostic imaging of heart and coronary circulation: Secondary | ICD-10-CM

## 2017-09-15 DIAGNOSIS — R9439 Abnormal result of other cardiovascular function study: Secondary | ICD-10-CM | POA: Diagnosis not present

## 2017-09-15 DIAGNOSIS — E785 Hyperlipidemia, unspecified: Secondary | ICD-10-CM | POA: Diagnosis not present

## 2017-09-15 DIAGNOSIS — Z79899 Other long term (current) drug therapy: Secondary | ICD-10-CM

## 2017-09-15 MED ORDER — METOPROLOL TARTRATE 50 MG PO TABS: 50.0000 mg | ORAL_TABLET | Freq: Once | ORAL | 0 refills | Status: DC

## 2017-09-15 NOTE — Patient Instructions (Signed)
Medication Instructions:  Take- Metoprolol Tartrate 50 mg 1 hour before procedure  If you need a refill on your cardiac medications before your next appointment, please call your pharmacy.  Labwork: BMP HERE IN OUR OFFICE AT [facility]  Take the provided lab slips for you to take with you to the lab for you blood draw.   You will NOT need to fast   You may go to any [facility] lab that is convenient for you however, we do have a lab in our office that is able to assist you. You do NOT need an appointment for our lab. Once in our office lobby there is a podium to the right of the check-in desk where you are to sign-in and ring a doorbell to alert [place] you are here. Lab is open Monday-Friday from 8:00am to 4:00pm; and is closed for lunch from 12:45p-1:45pm    Testing/Procedures: Non-Cardiac CT Angiography (CTA), is a special type of CT scan that uses a computer to produce multi-dimensional views of major blood vessels throughout the body. In CT angiography, a contrast material is injected through an IV to help visualize the blood vessels  Special Instructions: Please arrive at the [facility] main entrance of [facility] at          AM (30-45 minutes prior to test start time)  [facility] [address] [place], [place] [address] [phone]  Proceed to the [facility] Radiology Department (First Floor).  Please follow these instructions carefully (unless otherwise directed):  Hold all erectile dysfunction medications at least 48 hours prior to test.  On the Night Before the Test: . Drink plenty of water. . Do not consume any caffeinated/decaffeinated beverages or chocolate 12 hours prior to your test. . Do not take any antihistamines 12 hours prior to your test. . If you take Metformin do not take 24 hours prior to test.   On the Day of the Test: . Drink plenty of water. Do not drink any water within one hour of the test. . Do not eat any food 4 hours prior to  the test. . You may take your regular medications prior to the test. . IF NOT ON A BETA BLOCKER - Take 50 mg of lopressor (metoprolol) one hour before the test.  After the Test: . Drink plenty of water. . After receiving IV contrast, you may experience a mild flushed feeling. This is normal. . On occasion, you may experience a mild rash up to 24 hours after the test. This is not dangerous. If this occurs, you can take Benadryl 25 mg and increase your fluid intake. . If you experience trouble breathing, this can be serious. If it is severe call 911 IMMEDIATELY. If it is mild, please call our office. . If you take any of these medications: Glipizide/Metformin, Avandament, Glucavance, please do not take 48 hours after completing test.   Follow-Up: Your physician wants you to follow-up in: After CTA.     Thank you for choosing [facility] at [facility]!!

## 2017-09-19 ENCOUNTER — Ambulatory Visit: Payer: 59 | Admitting: Cardiology

## 2018-01-19 ENCOUNTER — Telehealth: Payer: Self-pay | Admitting: Cardiology

## 2018-01-19 LAB — BASIC METABOLIC PANEL
BUN / CREAT RATIO: 18 (ref 9–20)
BUN: 14 mg/dL (ref 6–24)
CO2: 25 mmol/L (ref 20–29)
CREATININE: 0.78 mg/dL (ref 0.76–1.27)
Calcium: 9.6 mg/dL (ref 8.7–10.2)
Chloride: 95 mmol/L — ABNORMAL LOW (ref 96–106)
GFR, EST AFRICAN AMERICAN: 117 mL/min/{1.73_m2} (ref >59)
GFR, EST NON AFRICAN AMERICAN: 102 mL/min/{1.73_m2} (ref >59)
Glucose: 89 mg/dL (ref 65–99)
Potassium: 4.4 mmol/L (ref 3.5–5.2)
Sodium: 134 mmol/L (ref 134–144)

## 2018-01-19 NOTE — Telephone Encounter (Signed)
New Message    Patient is calling because he was to come in for labs today but he is not sure if he can still have the labs since he ate. He wants to discuss because he has a CT scheduled for Monday. Please call.

## 2018-01-19 NOTE — Telephone Encounter (Signed)
Spoke with pt and advised that he is ok to get labs drawn even though he ate breakfast this morning as he is only getting a BMP drawn. Verbalized understanding.

## 2018-01-22 ENCOUNTER — Ambulatory Visit (HOSPITAL_COMMUNITY)
Admission: RE | Admit: 2018-01-22 | Discharge: 2018-01-22 | Disposition: A | Discharge status: Home / Self Care | Payer: 59 | Source: Ambulatory Visit | Attending: Cardiology | Admitting: Cardiology

## 2018-01-22 DIAGNOSIS — D3502 Benign neoplasm of left adrenal gland: Secondary | ICD-10-CM | POA: Diagnosis not present

## 2018-01-22 DIAGNOSIS — I2510 Atherosclerotic heart disease of native coronary artery without angina pectoris: Secondary | ICD-10-CM | POA: Insufficient documentation

## 2018-01-22 DIAGNOSIS — R9439 Abnormal result of other cardiovascular function study: Secondary | ICD-10-CM

## 2018-01-22 MED ORDER — IOPAMIDOL (ISOVUE-370) INJECTION 76%
INTRAVENOUS | Status: AC
  Administered 2018-01-22: 15:00:00
  Filled 2018-01-22: qty 100

## 2018-01-22 MED ORDER — METOPROLOL TARTRATE 5 MG/5ML IV SOLN
5.0000 mg | Freq: Once | INTRAVENOUS | Status: AC
  Administered 2018-01-22: 5 mg via INTRAVENOUS
  Filled 2018-01-22: qty 5

## 2018-01-22 MED ORDER — METOPROLOL TARTRATE 5 MG/5ML IV SOLN
INTRAVENOUS | Status: AC
  Filled 2018-01-22: qty 5

## 2018-01-22 MED ORDER — NITROGLYCERIN 0.4 MG SL SUBL
SUBLINGUAL_TABLET | SUBLINGUAL | Status: AC
  Filled 2018-01-22: qty 2

## 2018-01-22 MED ORDER — NITROGLYCERIN 0.4 MG SL SUBL
0.8000 mg | SUBLINGUAL_TABLET | Freq: Once | SUBLINGUAL | Status: AC
  Administered 2018-01-22: 0.8 mg via SUBLINGUAL
  Filled 2018-01-22: qty 25

## 2018-02-23 ENCOUNTER — Other Ambulatory Visit: Payer: Self-pay | Admitting: Family Medicine

## 2018-02-23 DIAGNOSIS — R109 Unspecified abdominal pain: Secondary | ICD-10-CM

## 2018-03-01 ENCOUNTER — Other Ambulatory Visit: Payer: 59

## 2018-05-29 ENCOUNTER — Telehealth (HOSPITAL_COMMUNITY): Payer: Self-pay | Admitting: Cardiology

## 2018-05-29 NOTE — Telephone Encounter (Signed)
Returned call to patient he stated he faxed a appeal letter asking Dr.Hochrein for support on 05/18/18.He was calling to see if Dr.Hochrein received letter.Advised to re fax letter to fax # [phone].I will send message to Dr.Hochrein's CMA.

## 2018-05-29 NOTE — Telephone Encounter (Signed)
New message   Patient's states that he faxed a form asking Dr. Percival Spanish to support his appeal with [employer]. The fax was sent 05/18/2018. Please contact the patient to discuss.

## 2018-05-29 NOTE — Telephone Encounter (Signed)
Spoke with pt letting him know he will have letter by the ending of week pt voice understanding

## 2018-06-01 NOTE — Telephone Encounter (Signed)
Spoke with pt about the appeal letter he want Dr Percival Spanish to [facility], after reviewing pt chart pt do have CAD and he have a LDL 152 in which pt do need to be on a treatment therapy for, I explained to pt he will need to come and and talk to Dr Percival Spanish about this, pt stated he will think and it and call back to make an appt.

## 2018-08-20 NOTE — Progress Notes (Signed)
    Cardiology Office Note   Date:  08/22/2018   ID:  Rodney Banks, DOB Feb 10, 1961, MRN [id number]  PCP:  London Pepper, MD  Cardiologist:   Minus Breeding, MD  Referring:  London Pepper, MD   No chief complaint on file.    History of Present Illness: Rodney Banks is a 58 y.o. male who is referred by London Pepper, MD for evaluation of coronary calcium.  He has SOB and sent him for a calcium score that was 174 and 86th percentile.  I sent him for a POET (Plain Old Exercise Treadmill).    At the very peak of exercise there were ST changes that could represent ischemia.  He had no stenosis on CTA.    He returns to discuss a letter.  He has been turned down for health insurance.  This is despite the fact that he has had no cardiovascular symptoms.     Past Medical History:  Diagnosis Date  . Atrial fibrillation ([place])   . Elevated coronary artery calcium score   . Hypercholesteremia   . Vitamin D deficiency     Past Surgical History:  Procedure Laterality Date  . bowel obstruction    . fractureed right ankle    . MENISCECTOMY     both knees  . right leg     fracture surgical repair  . VASECTOMY       No current outpatient medications on file.   No current facility-administered medications for this visit.     Allergies:   Patient has no known allergies.    PHYSICAL EXAM: The patient did not want vitals or physical exam.   EKG:  EKG is not ordered today.   Recent Labs: 01/19/2018: BUN 14; Creatinine, Ser 0.78; Potassium 4.4; Sodium 134    Lipid Panel No results found for: CHOL, TRIG, HDL, CHOLHDL, VLDL, LDLCALC, LDLDIRECT    Wt Readings from Last 3 Encounters:  09/15/17 176 lb 12.8 oz (80.2 kg)  07/13/17 173 lb (78.5 kg)  04/28/15 178 lb (80.7 kg)      Other studies Reviewed: Additional studies/ records that were reviewed today include: None Review of the above records demonstrates:     ASSESSMENT AND PLAN:  CORONARY CALCIUM:     I sent a letter  indicating that this patient was low risk and should be ensured able.  Please see a copy of that letter elsewhere in the medical record.  DYSLIPIDEMIA:  He has a MESA score of 8.8.  He has opted for conservative therapy..    Current medicines are reviewed at length with the patient today.  The patient does not have concerns regarding medicines.  The following changes have been made:   None  Labs/ tests ordered today include:  None  No orders of the defined types were placed in this encounter.    Disposition:   FU with me in as previously suggested. Ronnell Guadalajara, MD  08/22/2018 3:58 PM    [place]

## 2018-08-21 ENCOUNTER — Encounter: Payer: Self-pay | Admitting: Cardiology

## 2018-08-22 ENCOUNTER — Encounter: Payer: Self-pay | Admitting: Cardiology

## 2018-08-22 ENCOUNTER — Ambulatory Visit (INDEPENDENT_AMBULATORY_CARE_PROVIDER_SITE_OTHER): Payer: BLUE CROSS/BLUE SHIELD | Admitting: Cardiology

## 2018-08-22 VITALS — Ht 69.0 in

## 2018-08-22 DIAGNOSIS — R931 Abnormal findings on diagnostic imaging of heart and coronary circulation: Secondary | ICD-10-CM | POA: Diagnosis not present

## 2018-11-04 IMAGING — CT CT HEART SCORING
2 series · 16 of 20 positions shown, 18 images · non-contrast
Comparison: None.

CLINICAL DATA: Risk stratification

EXAM:
Coronary Calcium Score
TECHNIQUE: The patient was scanned on a Siemens Somatom 64 slice scanner. Axial
non-contrast 3 mm slices were carried out through the heart. The
data set was analyzed on a dedicated work station and scored using
the Agatson method.

[Series 3: casc 3.0 i36f 2 bestdiast 67 % · axial · 0.42mm/px · z∈[-235,-142]mm · 8 of 41 slices shown, 10 images]
[im 5/41  vessel]
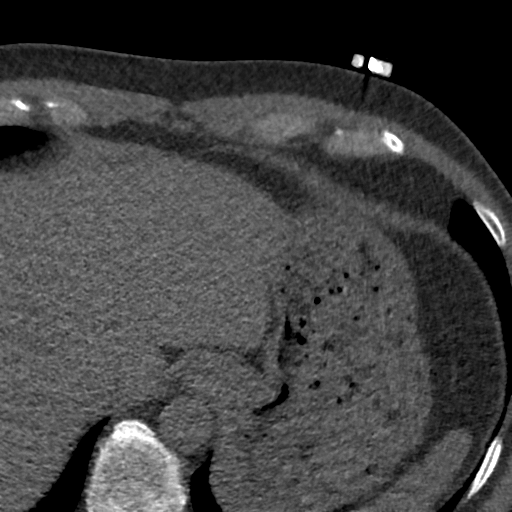
[im 5/41  lung]
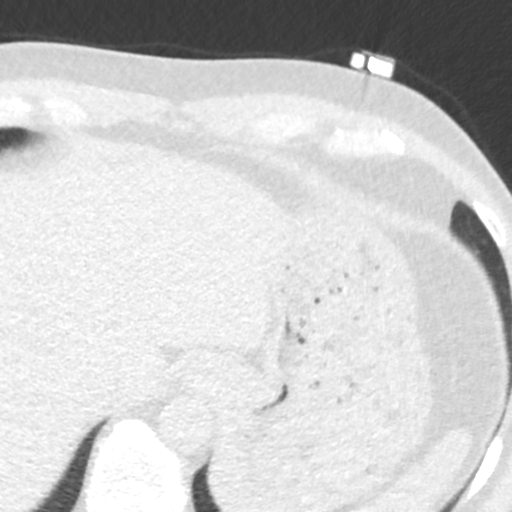
[im 9/41  vessel]
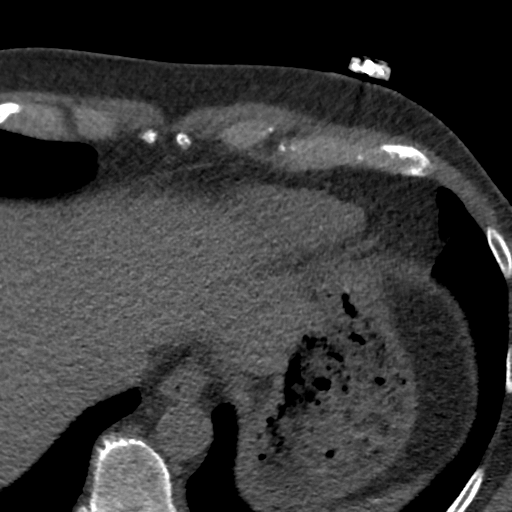
[im 14/41  vessel]
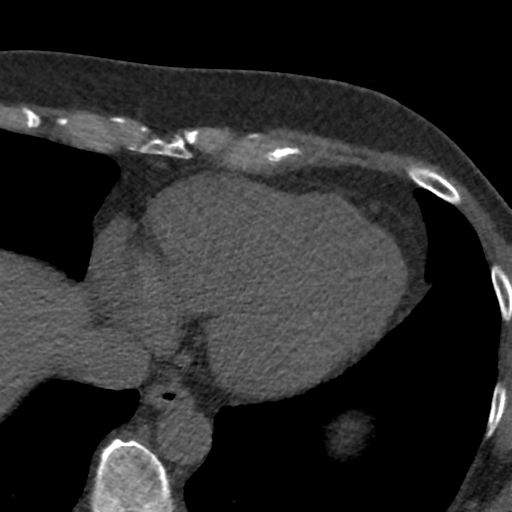
[im 18/41  vessel]
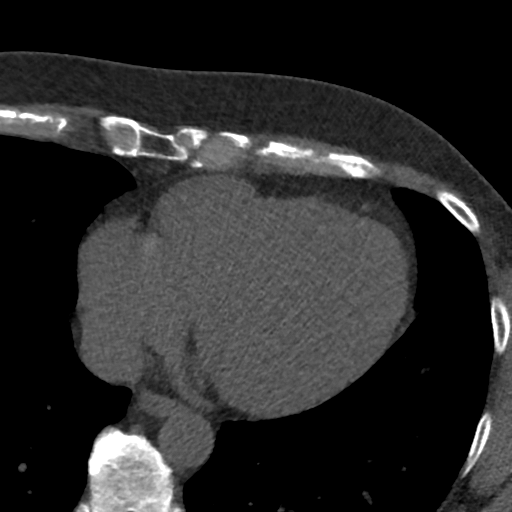
[im 23/41  vessel]
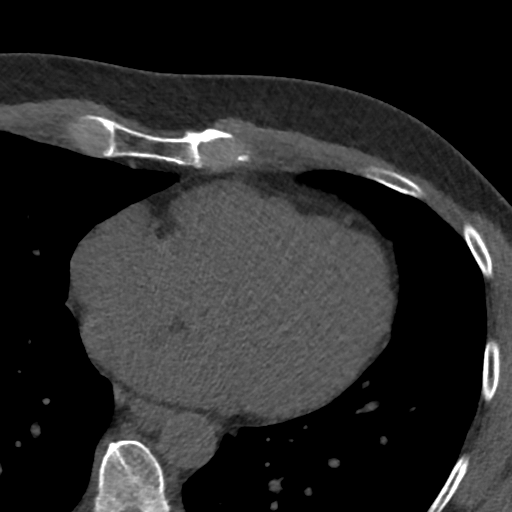
[im 23/41  lung]
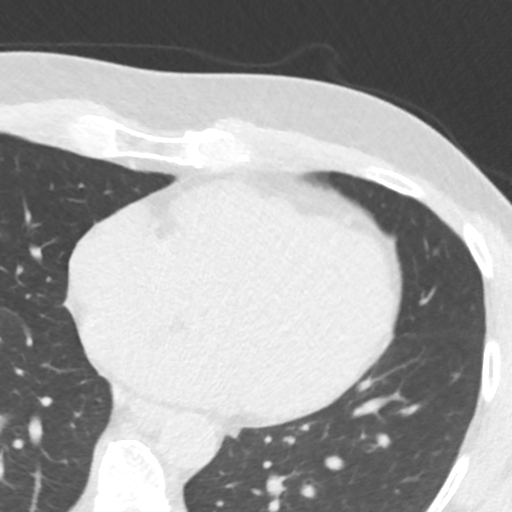
[im 27/41  vessel]
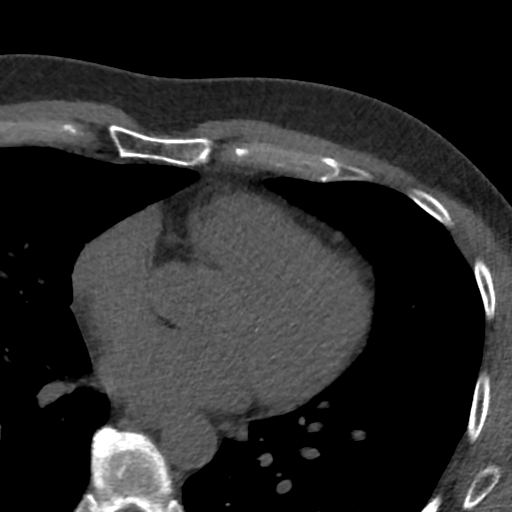
[im 32/41  vessel]
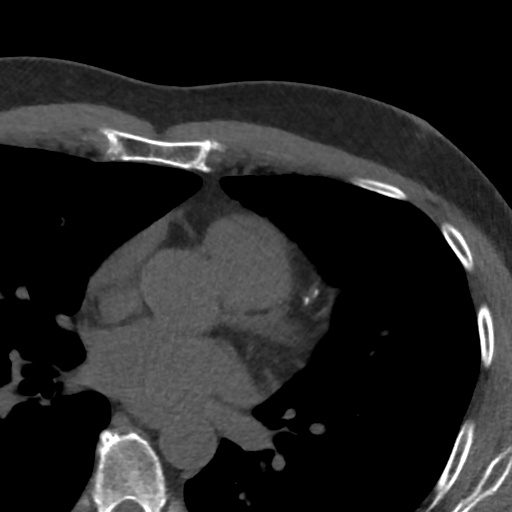
[im 36/41  vessel]
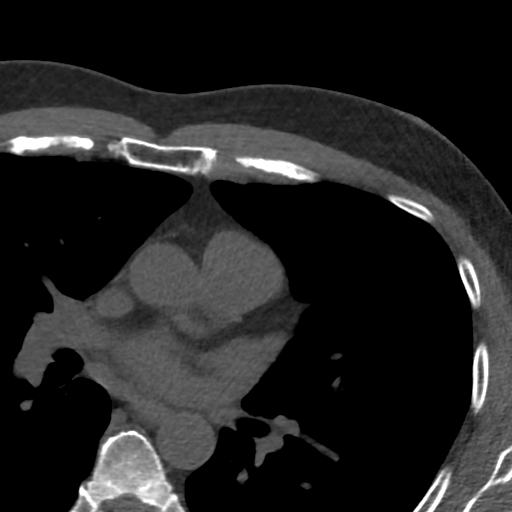

[Series 5: lung st 67 % · axial · 0.68mm/px · z∈[-234,-142]mm · 8 of 41 slices shown]
[im 5/41  lung]
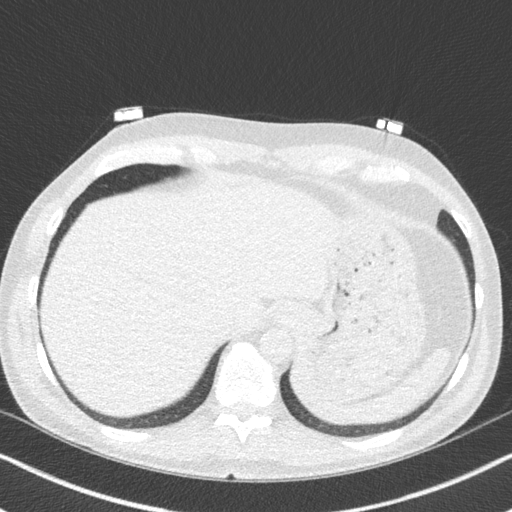
[im 9/41  lung]
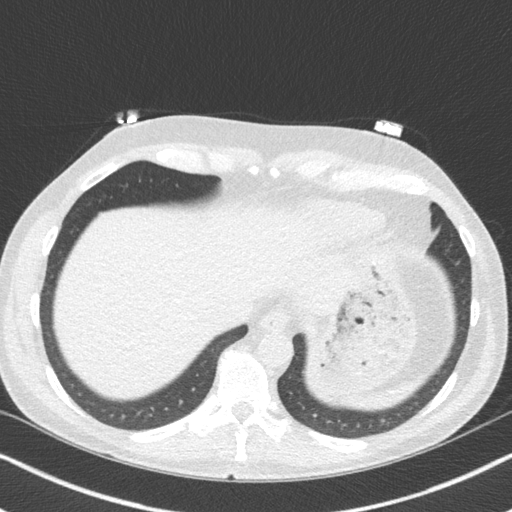
[im 14/41  lung]
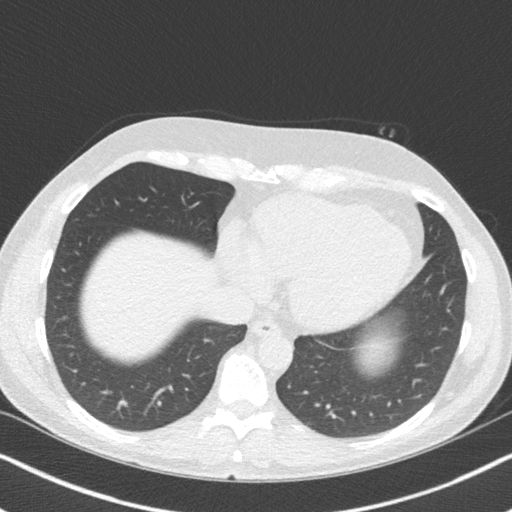
[im 18/41  lung]
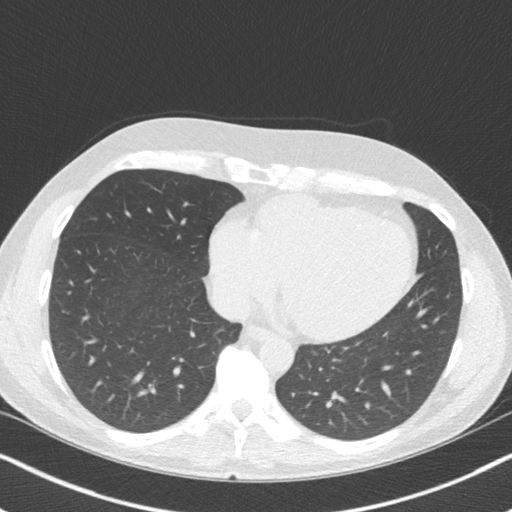
[im 23/41  lung]
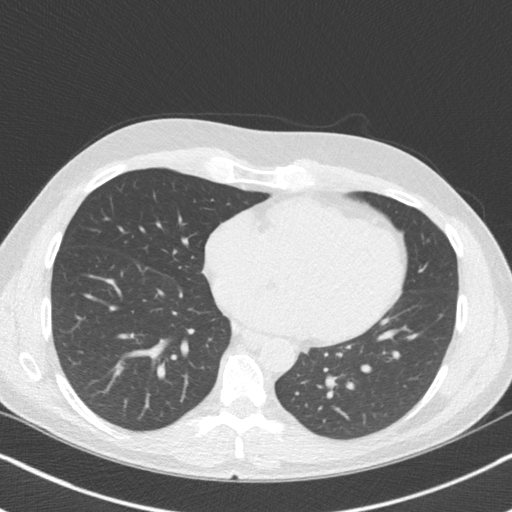
[im 27/41  lung]
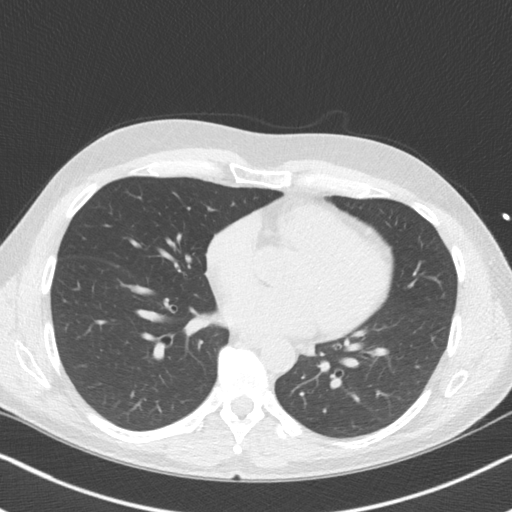
[im 32/41  lung]
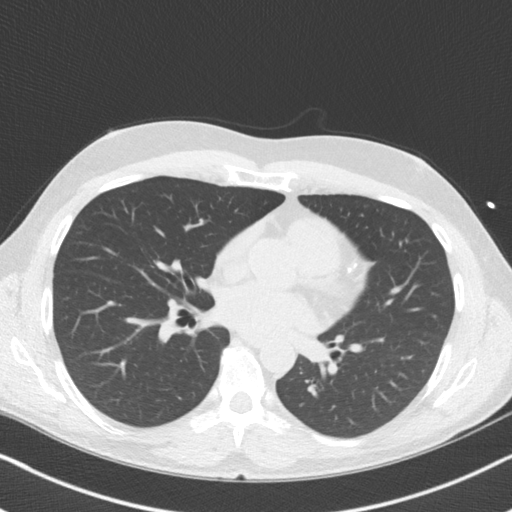
[im 36/41  lung]
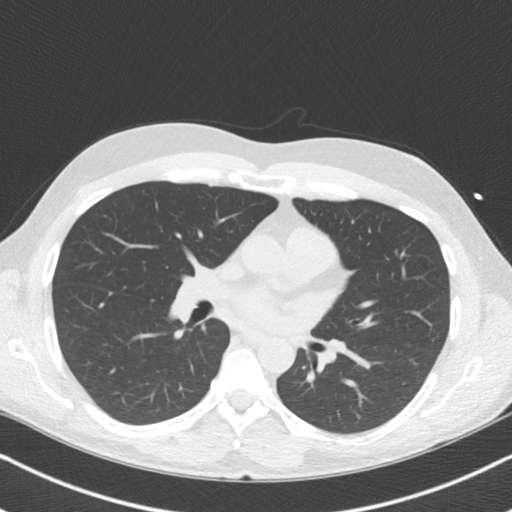

[16 of 20 positions shown; findings below may reference images not displayed]

FINDINGS: Non-cardiac: See separate report from [REDACTED].

Ascending Aorta:  Normal 2.7 cm

Pericardium: Normal

Coronary arteries: Moderate calcium noted in LAD/Diagonal Circumflex
and OM branches
IMPRESSION: Coronary calcium score of 174. This was 86 th percentile for age and
sex matched control.

Sum Venkat

EXAM:
OVER-READ INTERPRETATION  CT CHEST

The following report is an over-read performed by radiologist Dr.
Piet Van Berens [REDACTED] on 07/20/2017. This over-read
does not include interpretation of cardiac or coronary anatomy or
pathology. The coronary calcium score interpretation by the
cardiologist is attached.
FINDINGS: Vascular: Heart is upper limits normal in size. Visualized aorta
normal caliber.

Mediastinum/Nodes: No adenopathy in the lower mediastinum or hila.

Lungs/Pleura: Visualized lungs clear.  No effusions.

Upper Abdomen: Imaging into the upper abdomen shows no acute
findings.

Musculoskeletal: Chest wall soft tissues are unremarkable. No acute
bony abnormality.
IMPRESSION: No acute or significant extracardiac abnormality.

## 2020-07-16 ENCOUNTER — Other Ambulatory Visit: Payer: Self-pay | Admitting: Internal Medicine

## 2020-07-16 DIAGNOSIS — E785 Hyperlipidemia, unspecified: Secondary | ICD-10-CM

## 2020-08-25 ENCOUNTER — Other Ambulatory Visit: Payer: BLUE CROSS/BLUE SHIELD

## 2020-09-17 ENCOUNTER — Encounter: Payer: Self-pay | Admitting: Gastroenterology

## 2022-01-05 ENCOUNTER — Ambulatory Visit: Payer: BLUE CROSS/BLUE SHIELD | Admitting: Cardiology

## 2022-01-05 ENCOUNTER — Encounter: Payer: Self-pay | Admitting: Cardiology

## 2022-01-05 DIAGNOSIS — R931 Abnormal findings on diagnostic imaging of heart and coronary circulation: Secondary | ICD-10-CM | POA: Insufficient documentation

## 2022-01-05 DIAGNOSIS — R002 Palpitations: Secondary | ICD-10-CM | POA: Insufficient documentation

## 2022-01-05 NOTE — Progress Notes (Incomplete)
Cardiology Office Note   Date:  01/06/2022   ID:  Rodney Banks, DOB [birth year], MRN [id number]  PCP:  London Pepper, MD  Cardiologist:   None Referring:  London Pepper, MD   Chief Complaint  Patient presents with   Palpitations      History of Present Illness: Rodney Banks is a 61 y.o. male who presents for evaluation of palpitations.Marland Kitchen  He is referred by London Pepper, MD.  I saw him greater than 3 years ago for elevated coronary calcium.  POET (Plain Old Exercise Treadmill) suggested possible ischemia but coronary CTA was negative for obstructive disease.  He recently had palpitations.  He reports having atrial fib when he was in his 55s.  He says over the past few weeks he has had increased heart rate.  This happens at rest.  It might last for half hour at a time.  Feels like his previous atrial fibrillation.  He feels weak and dizzy.  He notices his heart rate is fast.  He cannot bring this on.  He does not associated with activity.  It happens at rest.  He has not any triggers.  He exercises 4 times a week 90 minutes at a time and develops no symptoms.  He denies chest pressure, neck or arm discomfort.  He said no shortness of breath, presyncope or syncope.  He has had some slow weight gain over time and has been given a prescription for [facility] which she has not started yet.   Past Medical History:  Diagnosis Date   Atrial fibrillation (HCC)    Elevated coronary artery calcium score    Hypercholesteremia    Vitamin D deficiency     Past Surgical History:  Procedure Laterality Date   bowel obstruction     fractureed right ankle     MENISCECTOMY     both knees   right leg     fracture surgical repair   VASECTOMY       Current Outpatient Medications  Medication Sig Dispense Refill   rosuvastatin (CRESTOR) 20 MG tablet Take 1 tablet (20 mg total) by mouth daily. 90 tablet 3   Semaglutide-Weight Management 0.25 MG/0.5ML SOAJ Inject into the skin.     No current  facility-administered medications for this visit.    Allergies:   Patient has no known allergies.    Social History:  The patient  reports that he has never smoked. He has never used smokeless tobacco. He reports current alcohol use of about 1.0 standard drink per week. He reports that he does not use drugs.   Family History:  The patient's family history includes Cancer - Colon in his mother; Colon cancer in his mother; Heart attack in his father; Heart disease in his paternal grandfather; Lung disease in his maternal grandfather.    ROS:  Please see the history of present illness.   Otherwise, review of systems are positive for tinnitus.   All other systems are reviewed and negative.    PHYSICAL EXAM: VS:  BP 120/72   Pulse 64   Ht '5\' 8"'$  (1.727 m)   Wt 194 lb 12.8 oz (88.4 kg)   SpO2 98%   BMI 29.62 kg/m  , BMI Body mass index is 29.62 kg/m. GENERAL:  Well appearing HEENT:  Pupils equal round and reactive, fundi not visualized, oral mucosa unremarkable NECK:  No jugular venous distention, waveform within normal limits, carotid upstroke brisk and symmetric, no bruits, no thyromegaly LYMPHATICS:  No cervical, inguinal  adenopathy LUNGS:  Clear to auscultation bilaterally BACK:  No CVA tenderness CHEST:  Unremarkable HEART:  PMI not displaced or sustained,S1 and S2 within normal limits, no S3, no S4, no clicks, no rubs, no murmurs ABD:  Flat, positive bowel sounds normal in frequency in pitch, no bruits, no rebound, no guarding, no midline pulsatile mass, no hepatomegaly, no splenomegaly EXT:  2 plus pulses throughout, no edema, no cyanosis no clubbing SKIN:  No rashes no nodules NEURO:  Cranial nerves II through XII grossly intact, motor grossly intact throughout PSYCH:  Cognitively intact, oriented to person place and time    EKG:  EKG is ordered today. The ekg ordered today demonstrates sinus rhythm, rate 64, axis within normal limits, intervals within normal limits, no acute  ST-T wave changes.   Recent Labs: No results found for requested labs within last 8760 hours.    Lipid Panel No results found for: CHOL, TRIG, HDL, CHOLHDL, VLDL, LDLCALC, LDLDIRECT    Wt Readings from Last 3 Encounters:  01/06/22 194 lb 12.8 oz (88.4 kg)  09/15/17 176 lb 12.8 oz (80.2 kg)  07/13/17 173 lb (78.5 kg)      Other studies Reviewed: Additional studies/ records that were reviewed today include: Labs. Review of the above records demonstrates:  Please see elsewhere in the note.     ASSESSMENT AND PLAN:  Elevated coronary calcium: We have another discussion about this.  We talked about primary risk reduction.  He is active and has no symptoms.  No further testing.  Palpitations: I am going to have him wear a 4-week monitor.  I do note that his recent labs to include electrolytes and TSH were normal.  He is low risk for thromboembolic events and needs no anticoagulation at this point.  He is also going to look into getting a wearable.  Dyslipidemia: With his elevated coronary calcium I would suggest a goal LDL at least less than 100.  He was on Crestor at 1 point in time but he does not think it really made a difference.  He is going to start Crestor 20 mg and a lipid profile in 10 weeks.  Current medicines are reviewed at length with the patient today.  The patient does not have concerns regarding medicines.  The following changes have been made:  no change  Labs/ tests ordered today include:   Orders Placed This Encounter  Procedures   Lipid panel   Hepatic function panel   Cardiac event monitor   EKG 12-Lead     Disposition:   FU with me in 3 months     Signed, Minus Breeding, MD  01/06/2022 8:56 AM    Rodney Banks

## 2022-01-06 ENCOUNTER — Encounter: Payer: Self-pay | Admitting: *Deleted

## 2022-01-06 ENCOUNTER — Ambulatory Visit (INDEPENDENT_AMBULATORY_CARE_PROVIDER_SITE_OTHER): Payer: BLUE CROSS/BLUE SHIELD | Admitting: Cardiology

## 2022-01-06 ENCOUNTER — Encounter: Payer: Self-pay | Admitting: Cardiology

## 2022-01-06 VITALS — BP 120/72 | HR 64 | Ht 68.0 in | Wt 194.8 lb

## 2022-01-06 DIAGNOSIS — I480 Paroxysmal atrial fibrillation: Secondary | ICD-10-CM | POA: Diagnosis not present

## 2022-01-06 DIAGNOSIS — R002 Palpitations: Secondary | ICD-10-CM

## 2022-01-06 DIAGNOSIS — R931 Abnormal findings on diagnostic imaging of heart and coronary circulation: Secondary | ICD-10-CM

## 2022-01-06 MED ORDER — ROSUVASTATIN CALCIUM 20 MG PO TABS: 20.0000 mg | ORAL_TABLET | Freq: Every day | ORAL | 3 refills | Status: AC

## 2022-01-06 NOTE — Progress Notes (Signed)
Patient ID: Rodney Banks, male   DOB: 11-25-1960, 61 y.o.   MRN: [id number]  Patient enrolled for [employer] to ship a cardiac event monitor to his address on file.  Letter with instructions mailed to patient.

## 2022-01-06 NOTE — Patient Instructions (Signed)
Medication Instructions:   START ROSUVASTATIN 20 MG ONCE DAILY  *If you need a refill on your cardiac medications before your next appointment, please call your pharmacy*   Lab Work:  Your physician recommends that you return for lab work in: [place]  If you have labs (blood work) drawn today and your tests are completely normal, you will receive your results only by: [place] (if you have MyChart) OR A paper copy in the mail If you have any lab test that is abnormal or we need to change your treatment, we will call you to review the results.   Testing/Procedures:  Your physician has recommended that you wear an event monitor. Event monitors are medical devices that record the heart's electrical activity. Doctors most often [place] these monitors to diagnose arrhythmias. Arrhythmias are problems with the speed or rhythm of the heartbeat. The monitor is a small, portable device. You can wear one while you do your normal daily activities. This is usually used to diagnose what is causing palpitations/syncope (passing out).    Follow-Up: At [facility], you and your health needs are our priority.  As part of our continuing mission to provide you with exceptional heart care, we have created designated Provider Care Teams.  These Care Teams include your primary Cardiologist (physician) and Advanced Practice Providers (APPs -  Physician Assistants and Nurse Practitioners) who all work together to provide you with the care you need, when you need it.  We recommend signing up for the patient portal called "MyChart".  Sign up information is provided on this After Visit Summary.  MyChart is used to connect with patients for Virtual Visits (Telemedicine).  Patients are able to view lab/test results, encounter notes, upcoming appointments, etc.  Non-urgent messages can be sent to your provider as well.   To learn more about what you can do with MyChart, go to [email or web address].     Your next appointment:   3 month(s)  The format for your next appointment:   In Person  Provider:   Minus Breeding MD    Other Instructions  [place]

## 2022-01-10 ENCOUNTER — Telehealth: Payer: Self-pay | Admitting: Cardiology

## 2022-01-10 NOTE — Telephone Encounter (Signed)
Patient called and was to told to call and talk with Dr. Percival Spanish or nurse regarding insurance issue. Please call back to discuss

## 2022-01-11 ENCOUNTER — Encounter: Payer: Self-pay | Admitting: Cardiology

## 2022-04-08 ENCOUNTER — Ambulatory Visit: Payer: BLUE CROSS/BLUE SHIELD | Admitting: Cardiology

## 2022-05-26 ENCOUNTER — Encounter: Payer: Self-pay | Admitting: *Deleted

## 2023-11-01 ENCOUNTER — Other Ambulatory Visit: Payer: Self-pay | Admitting: Medical Genetics

## 2023-11-10 ENCOUNTER — Other Ambulatory Visit

## 2023-11-10 DIAGNOSIS — Z006 Encounter for examination for normal comparison and control in clinical research program: Secondary | ICD-10-CM

## 2023-11-21 LAB — GENECONNECT MOLECULAR SCREEN: Genetic Analysis Overall Interpretation: NEGATIVE
